# Patient Record
Sex: Female | Born: 1995 | Race: White | Hispanic: No | Marital: Single | Smoking: Never smoker
Health system: Southern US, Community
[De-identification: ages and names within clinical notes are randomized; demographics above are authoritative.]

## PROBLEM LIST (undated history)

## (undated) HISTORY — PX: TONSILECTOMY, ADENOIDECTOMY, BILATERAL MYRINGOTOMY AND TUBES: SHX2538

## (undated) HISTORY — PX: NASAL SEPTUM SURGERY: SHX37

---
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
## 2016-07-10 ENCOUNTER — Ambulatory Visit (INDEPENDENT_AMBULATORY_CARE_PROVIDER_SITE_OTHER): Payer: BLUE CROSS/BLUE SHIELD | Admitting: Physician Assistant

## 2016-07-10 VITALS — BP 122/80 | HR 75 | Temp 97.6°F | Resp 18 | Ht 65.0 in | Wt 159.0 lb

## 2016-07-10 DIAGNOSIS — Z3041 Encounter for surveillance of contraceptive pills: Secondary | ICD-10-CM | POA: Diagnosis not present

## 2016-07-10 LAB — POCT URINE PREGNANCY: Preg Test, Ur: NEGATIVE

## 2016-07-10 MED ORDER — LEVONORGESTREL-ETHINYL ESTRAD 0.1-20 MG-MCG PO TABS: 1.0000 | ORAL_TABLET | Freq: Every day | ORAL | 6 refills | Status: DC

## 2016-07-10 NOTE — Patient Instructions (Addendum)
Oral Contraception Information Oral contraceptive pills (OCPs) are medicines taken to prevent pregnancy. OCPs work by preventing the ovaries from releasing eggs. The hormones in OCPs also cause the cervical mucus to thicken, preventing the sperm from entering the uterus. The hormones also cause the uterine lining to become thin, not allowing a fertilized egg to attach to the inside of the uterus. OCPs are highly effective when taken exactly as prescribed. However, OCPs do not prevent sexually transmitted diseases (STDs). Safe sex practices, such as using condoms along with the pill, can help prevent STDs.  Before taking the pill, you may have a physical exam and Pap test. Your health care provider may order blood tests. The health care provider will make sure you are a good candidate for oral contraception. Discuss with your health care provider the possible side effects of the OCP you may be prescribed. When starting an OCP, it can take 2 to 3 months for the body to adjust to the changes in hormone levels in your body.  TYPES OF ORAL CONTRACEPTION  The combination pill--This pill contains estrogen and progestin (synthetic progesterone) hormones. The combination pill comes in 21-day, 28-day, or 91-day packs. Some types of combination pills are meant to be taken continuously (365-day pills). With 21-day packs, you do not take pills for 7 days after the last pill. With 28-day packs, the pill is taken every day. The last 7 pills are without hormones. Certain types of pills have more than 21 hormone-containing pills. With 91-day packs, the first 84 pills contain both hormones, and the last 7 pills contain no hormones or contain estrogen only.  The minipill--This pill contains the progesterone hormone only. The pill is taken every day continuously. It is very important to take the pill at the same time each day. The minipill comes in packs of 28 pills. All 28 pills contain the hormone.  ADVANTAGES OF ORAL  CONTRACEPTIVE PILLS  Decreases premenstrual symptoms.   Treats menstrual period cramps.   Regulates the menstrual cycle.   Decreases a heavy menstrual flow.   May treatacne, depending on the type of pill.   Treats abnormal uterine bleeding.   Treats polycystic ovarian syndrome.   Treats endometriosis.   Can be used as emergency contraception.  THINGS THAT CAN MAKE ORAL CONTRACEPTIVE PILLS LESS EFFECTIVE OCPs can be less effective if:   You forget to take the pill at the same time every day.   You have a stomach or intestinal disease that lessens the absorption of the pill.   You take OCPs with other medicines that make OCPs less effective, such as antibiotics, certain HIV medicines, and some seizure medicines.   You take expired OCPs.   You forget to restart the pill on day 7, when using the packs of 21 pills.  RISKS ASSOCIATED WITH ORAL CONTRACEPTIVE PILLS  Oral contraceptive pills can sometimes cause side effects, such as:  Headache.  Nausea.  Breast tenderness.  Irregular bleeding or spotting. Combination pills are also associated with a small increased risk of:  Blood clots.  Heart attack.  Stroke.   This information is not intended to replace advice given to you by your health care provider. Make sure you discuss any questions you have with your health care provider.   Document Released: 02/24/2003 Document Revised: 09/24/2013 Document Reviewed: 05/25/2013 Elsevier Interactive Patient Education 2016 [employer].  Safe Sex Safe sex is about reducing the risk of giving or getting a sexually transmitted disease (STD). STDs are spread through  sexual contact involving the genitals, mouth, or rectum. Some STDs can be cured and others cannot. Safe sex can also prevent unintended pregnancies.  WHAT ARE SOME SAFE SEX PRACTICES?  Limit your sexual activity to only one partner who is having sex with only you.  Talk to your partner about his or  her past partners, past STDs, and drug use.  Use a condom every time you have sexual intercourse. This includes vaginal, oral, and anal sexual activity. Both females and males should wear condoms during oral sex. Only use latex or polyurethane condoms and water-based lubricants. Using petroleum-based lubricants or oils to lubricate a condom will weaken the condom and increase the chance that it will break. The condom should be in place from the beginning to the end of sexual activity. Wearing a condom reduces, but does not completely eliminate, your risk of getting or giving an STD. STDs can be spread by contact with infected body fluids and skin.  Get vaccinated for hepatitis B and HPV.  Avoid alcohol and recreational drugs, which can affect your judgment. You may forget to use a condom or participate in high-risk sex.  For females, avoid douching after sexual intercourse. Douching can spread an infection farther into the reproductive tract.  Check your body for signs of sores, blisters, rashes, or unusual discharge. See your health care provider if you notice any of these signs.  Avoid sexual contact if you have symptoms of an infection or are being treated for an STD. If you or your partner has herpes, avoid sexual contact when blisters are present. Use condoms at all other times.  If you are at risk of being infected with HIV, it is recommended that you take a prescription medicine daily to prevent HIV infection. This is called pre-exposure prophylaxis (PrEP). You are considered at risk if:  You are a man who has sex with other men (MSM).  You are a heterosexual man or woman who is sexually active with more than one partner.  You take drugs by injection.  You are sexually active with a partner who has HIV.  Talk with your health care provider about whether you are at high risk of being infected with HIV. If you choose to begin PrEP, you should first be tested for HIV. You should then be  tested every 3 months for as long as you are taking PrEP.  See your health care provider for regular screenings, exams, and tests for other STDs. Before having sex with a new partner, each of you should be screened for STDs and should talk about the results with each other. WHAT ARE THE BENEFITS OF SAFE SEX?   There is less chance of getting or giving an STD.  You can prevent unwanted or unintended pregnancies.  By discussing safe sex concerns with your partner, you may increase feelings of intimacy, comfort, trust, and honesty between the two of you.   This information is not intended to replace advice given to you by your health care provider. Make sure you discuss any questions you have with your health care provider.   Document Released: 01/11/2005 Document Revised: 12/25/2014 Document Reviewed: 05/27/2012 Elsevier Interactive Patient Education 2016 [employer].   IF you received an x-ray today, you will receive an invoice from [facility]. Please contact [facility] at [phone] with questions or concerns regarding your invoice.   IF you received labwork today, you will receive an invoice from [employer]. Please contact [facility] at [phone] with  questions or concerns regarding your invoice.   Our billing staff will not be able to assist you with questions regarding bills from these companies.  You will be contacted with the lab results as soon as they are available. The fastest way to get your results is to activate your My Chart account. Instructions are located on the last page of this paperwork. If you have not heard from [place] regarding the results in 2 weeks, please contact this office.

## 2016-07-10 NOTE — Progress Notes (Signed)
   Dayton Stephney  MRN: [id number] DOB: 1996-02-20  Subjective:  Megan [name] is a 20 y.o. female seen in office today for medication refill of tri-previfem.   Pt's birth control has been prescribed by the student health center at the [employer] since 09/2015. However, pt is not returning to this school in the Fall and therefore needs a new provider to prescribe/monitor her birth control.    Pt reports starting birth control for irregular periods. States she is still having irregular cycles and the the cycles typically last longer than 7 days. The current birth control did help with pt's heavy flow. Pt reports taking the pill within in the same hour every day but some days she is off by a few hours. LMP: 06/07/2016.   Pt is currently sexually active. States she uses condoms almost every time. Has had three female partners in the last year. Pt got tested for STD 3 months ago and has been with the same partner since this time. Notes the STD tests were negative.    Pt does not smoke.   Of note, pt states that the previous provider who prescribed the tri-previfem did not explain other birth control options and did not educate her much on safe sex education. Pt would like to hear other options and learn more about safe sex practices today.    Review of Systems  Constitutional: Negative.     There are no active problems to display for this patient.  No current outpatient prescriptions on file prior to visit.   No current facility-administered medications on file prior to visit.     No Known Allergies  Objective:  BP 122/80 (BP Location: Right Arm)   Pulse 75   Temp 97.6 F (36.4 C) (Oral)   Resp 18   Ht 5\' 5"  (1.651 m)   Wt 159 lb (72.1 kg)   LMP 06/10/2016   SpO2 99%   BMI 26.46 kg/m   Physical Exam  Constitutional: She is oriented to person, place, and time and well-developed, well-nourished, and in no distress.  HENT:  Head: Normocephalic  and atraumatic.  Eyes: Conjunctivae are normal.  Neck: Normal range of motion.  Pulmonary/Chest: Effort normal.  Neurological: She is alert and oriented to person, place, and time. Gait normal.  Skin: Skin is warm and dry.  Psychiatric: Affect normal.  Vitals reviewed.  Results for orders placed or performed in visit on 07/10/16 (from the past 24 hour(s))  POCT urine pregnancy     Status: None   Collection Time: 07/10/16  3:44 PM  Result Value Ref Range   Preg Test, Ur Negative Negative    Assessment and Plan :   1. Encounter for surveillance of contraceptive pills -Discontinue tri-previfem after completing current pack -Begin taking levonorgestrel-ethinyl estradiol (AVIANE) 0.1-20 MG-MCG tablet; Take 1 tablet by mouth daily.  Dispense: 1 Package; Refill: 6 - POCT urine pregnancy --Log any breakthrough bleeding experienced after 3 months on the new medication. If no side effects, call in 6 months for refill of medication. If side effects, come back for reevaluation of medication.  -Safe sex education given   Total of 30 minutes spent with patient, greater than 50% of which was spent in birth control counseling and safe sex education.   Benjiman Core PA-C  [facility] 07/10/2016 3:19 PM

## 2016-08-28 ENCOUNTER — Telehealth: Payer: Self-pay

## 2016-08-28 NOTE — Telephone Encounter (Signed)
Birth Control is not covered by insurance.  She would like something else. [employer]   [phone]  [facility] [address] [address] [facility] [address]

## 2016-08-30 NOTE — Telephone Encounter (Signed)
I have contacted pt's pharmacy. The pharmacist notes that last month the prescription had a 0 dollar copay but this month it was 23 dollars so she is wondering if something changed with her insurance. Since the pt had just picked up the refill, the pharmacist could not enter in other medications into pt's portal to see what similar birth control would be a 0 copay. The pharmacist recommended that the pt call her insurance company and ask what birth controls they cover fully. She also notes that in three weeks before pt picks up her next refill, that we could call her back and she would be able to enter in different birth controls to see what her insurance covers. I have called the patient and let her know her options. She states she will call her insurance and ask what similar birth control they will cover at 0 dollar copay. She is to call our office back with this information so I can put in the prescription for the new medication.

## 2016-08-30 NOTE — Telephone Encounter (Signed)
Medication  levonorgestrel-ethinyl estradiol (AVIANE) 0.1-20 MG-MCG tablet [[id number]]  levonorgestrel-ethinyl estradiol (AVIANE) 0.1-20 MG-MCG tablet [[id number]][[id number]]  Order Details  Dose: 1 tablet Route: Oral Frequency: Daily  Dispense Quantity:  1 Package Refills:  6 Fills remaining:  --        Sig: Take 1 tablet by mouth daily.       Is there a reason you chose this one, can we give pt an alternative?

## 2016-09-01 NOTE — Telephone Encounter (Signed)
Left message to call back  

## 2016-09-01 NOTE — Telephone Encounter (Signed)
Megan [name], I spoke with patient. She stated that she called her insurance company yesterday and they refused to give her a list of birth control pills that they covered. They told her to call the pharmacy and take up the issue with them. She also stated that she has not changed insurance companies since last month. She is not sure of what to do at this point.

## 2016-09-05 NOTE — Telephone Encounter (Signed)
Patient is calling to let [place] know that she's still trying to figure out why the birth control isn't covered but hasn't be able to get any answer from the insurance or the pharmacy.

## 2016-09-05 NOTE — Telephone Encounter (Signed)
Please have patient call back and tell her insurance company that it is required that they give her a list of approved medications. If she is still having trouble tell her to contact me one week before her refill is due and I will call her pharmacy and have them figure out at that time which birth control will be covered. Thanks!

## 2016-09-07 NOTE — Telephone Encounter (Signed)
Attempted to call pt. No answer. Message left to call back. 

## 2016-10-02 ENCOUNTER — Telehealth: Payer: Self-pay

## 2016-10-02 DIAGNOSIS — Z3041 Encounter for surveillance of contraceptive pills: Secondary | ICD-10-CM

## 2016-10-02 NOTE — Telephone Encounter (Signed)
Patient is calling to let [place] know that her birth control medication will only be covered by insurance if [place] calls the medication in and have it mailed ordered. Please advise!  Patient phone: [phone]  [facility]: [phone]

## 2016-10-05 MED ORDER — LEVONORGESTREL-ETHINYL ESTRAD 0.1-20 MG-MCG PO TABS: 1.0000 | ORAL_TABLET | Freq: Every day | ORAL | 2 refills | Status: DC

## 2016-10-05 NOTE — Telephone Encounter (Signed)
[facility] order ph [facility] is [facility], so sent RFs in there. OV notes from July indicate that we can send in 6 more mos RFs after orig 6 mos as long as pt is doing well on medication. I LMOM for pt advising I will send in the remainder of the whole year (after which she will need f/u for more), BUT that she should call back and plan to come back in sooner if she is having any problems on the medication. [facility].

## 2017-05-21 ENCOUNTER — Encounter: Payer: Self-pay | Admitting: Physician Assistant

## 2017-05-21 ENCOUNTER — Ambulatory Visit (INDEPENDENT_AMBULATORY_CARE_PROVIDER_SITE_OTHER): Payer: BLUE CROSS/BLUE SHIELD | Admitting: Physician Assistant

## 2017-05-21 ENCOUNTER — Telehealth: Payer: Self-pay | Admitting: Physician Assistant

## 2017-05-21 VITALS — BP 134/81 | HR 71 | Temp 98.1°F | Resp 16 | Ht 65.5 in | Wt 159.8 lb

## 2017-05-21 DIAGNOSIS — N62 Hypertrophy of breast: Secondary | ICD-10-CM | POA: Diagnosis not present

## 2017-05-21 DIAGNOSIS — G8929 Other chronic pain: Secondary | ICD-10-CM | POA: Diagnosis not present

## 2017-05-21 DIAGNOSIS — Z23 Encounter for immunization: Secondary | ICD-10-CM | POA: Diagnosis not present

## 2017-05-21 DIAGNOSIS — F909 Attention-deficit hyperactivity disorder, unspecified type: Secondary | ICD-10-CM

## 2017-05-21 DIAGNOSIS — M546 Pain in thoracic spine: Secondary | ICD-10-CM

## 2017-05-21 DIAGNOSIS — Z79899 Other long term (current) drug therapy: Secondary | ICD-10-CM | POA: Diagnosis not present

## 2017-05-21 DIAGNOSIS — Z3041 Encounter for surveillance of contraceptive pills: Secondary | ICD-10-CM

## 2017-05-21 DIAGNOSIS — J302 Other seasonal allergic rhinitis: Secondary | ICD-10-CM

## 2017-05-21 MED ORDER — FLUTICASONE PROPIONATE 50 MCG/ACT NA SUSP: 2.0000 | Freq: Every day | NASAL | 3 refills | Status: AC

## 2017-05-21 MED ORDER — LEVONORGESTREL-ETHINYL ESTRAD 0.1-20 MG-MCG PO TABS: 1.0000 | ORAL_TABLET | Freq: Every day | ORAL | 4 refills | Status: DC

## 2017-05-21 MED ORDER — ATOMOXETINE HCL 40 MG PO CAPS: 40.0000 mg | ORAL_CAPSULE | Freq: Every day | ORAL | 0 refills | Status: DC

## 2017-05-21 NOTE — Patient Instructions (Addendum)
  Strattera - Take in the morning. Start '40mg'$  qd x 1 week; May increase 60 mg qd x 1 week; may increase to '80mg'$  qd x 1 week. Please come back and see me in 4 weeks   You will receive a phone call to schedule appt with surgeon.   Thank you for coming in today. I hope you feel we met your needs.  Feel free to call [facility] if you have any questions or further requests.  Please consider signing up for MyChart if you do not already have it, as this is a great way to communicate with me.  Best,  Whitney McVey, PA-C   IF you received an x-ray today, you will receive an invoice from [facility]. Please contact [facility] at [phone] with questions or concerns regarding your invoice.   IF you received labwork today, you will receive an invoice from [place]. Please contact [facility] at [phone] with questions or concerns regarding your invoice.   Our billing staff will not be able to assist you with questions regarding bills from these companies.  You will be contacted with the lab results as soon as they are available. The fastest way to get your results is to activate your My Chart account. Instructions are located on the last page of this paperwork. If you have not heard from [place] regarding the results in 2 weeks, please contact this office.

## 2017-05-21 NOTE — Telephone Encounter (Signed)
[facility] [address] [facility] a prescription.  Pt came in to pharmacy stating that she could cut the Strattera capsule in half and take that way but this prescription cannot be cut or crushed since it is a capsule.  Please advise [phone]

## 2017-05-21 NOTE — Telephone Encounter (Signed)
Please see directions and advise

## 2017-05-21 NOTE — Progress Notes (Signed)
Particia [name]  MRN: [id number] DOB: June 29, 1996  PCP: Magdalene River, PA-C  Subjective:  Pt is a pleasant 21 year old female who presents to clinic for multiple complaints.   Medication refill - She needs refill of flonase, and birth control pills. She was switched to Avaine at her last OV due to irregular periods and heavy bleeding. Today she reports her bleeding is well controlled, much more predictable and manageable. Her periods last about 6-7 days. Denies medication side effects. Denies light headedness, dizziness, abdominal pain, back pain. She is having intercourse with one female partner, uses condoms. Feels confident about safe sex practices.  H/o seasonal allergies. C/o watery eyes, runny nose, sneezing. Uses flonase - needs refill. Works well for her. She is also taking Zyrtec.   Large breasts - pt reports large breast size x 5 years. She was a 78 F in high school and is now a 59 G. "It doesn't matter if I weight 115lbs or 150 lbs they are always really big". C/o back pain, shob while sleeping, pain with running, shoulders and arm going numb while asleep. She has to wear two sports bras to exercise, and her breasts are still not well supported.  She has been to physical therapy in the past for core and back strengthening. She is interested in breast reduction surgery.   Difficulty with concentration x several years - she went to [facility] psychology clinic recently for a psychological evaluation. She brought the paperwork with her today, showing she has ADHD symptoms. She has applied the recommendations as far as study tips, tutoring, more time for testing. These methods are helping, however she is still having a difficult time staying on task. She is interested in medication.   Review of Systems  Constitutional: Negative for chills, diaphoresis, fatigue and fever.  HENT: Positive for postnasal drip, rhinorrhea and sneezing.   Eyes: Positive for itching. Negative for  photophobia, pain and visual disturbance.  Respiratory: Positive for shortness of breath. Negative for cough, chest tightness and wheezing.   Cardiovascular: Negative for chest pain and palpitations.  Gastrointestinal: Negative for abdominal pain, diarrhea, nausea and vomiting.  Musculoskeletal: Positive for back pain.  Allergic/Immunologic: Positive for environmental allergies.  Neurological: Positive for numbness. Negative for weakness, light-headedness and headaches.  Psychiatric/Behavioral: Positive for decreased concentration and sleep disturbance.    There are no active problems to display for this patient.   Current Outpatient Prescriptions on File Prior to Visit  Medication Sig Dispense Refill  . fluticasone (FLONASE) 50 MCG/ACT nasal spray Place into both nostrils daily.    Marland Kitchen levonorgestrel-ethinyl estradiol (AVIANE) 0.1-20 MG-MCG tablet Take 1 tablet by mouth daily. 3 Package 2  . propranolol (INDERAL) 40 MG tablet Take 40 mg by mouth 3 (three) times daily.     No current facility-administered medications on file prior to visit.     No Known Allergies   Objective:  BP 134/81   Pulse 71   Temp 98.1 F (36.7 C) (Oral)   Resp 16   Ht 5' 5.5" (1.664 m)   Wt 159 lb 12.8 oz (72.5 kg)   LMP 05/14/2017   SpO2 98%   BMI 26.19 kg/m   Physical Exam  Constitutional: She is oriented to person, place, and time and well-developed, well-nourished, and in no distress. No distress.  Cardiovascular: Normal rate, regular rhythm and normal heart sounds.   Pulmonary/Chest: Effort normal and breath sounds normal. No respiratory distress.  Musculoskeletal:       Thoracic  back: She exhibits no tenderness and no bony tenderness.       Lumbar back: She exhibits no tenderness and no bony tenderness.  Neurological: She is alert and oriented to person, place, and time. GCS score is 15.  Skin: Skin is warm and dry.  Psychiatric: Mood, memory, affect and judgment normal.  Vitals  reviewed.   Assessment and Plan :  1. Encounter for medication management 2. Encounter for surveillance of contraceptive pills - levonorgestrel-ethinyl estradiol (AVIANE) 0.1-20 MG-MCG tablet; Take 1 tablet by mouth daily.  Dispense: 3 Package; Refill: 4 - Pt doing well with new OCPs, denies side effects. Safe sex practices reviewed. OK to refill 1 year.   3. Seasonal allergic rhinitis, unspecified trigger - fluticasone (FLONASE) 50 MCG/ACT nasal spray; Place 2 sprays into both nostrils daily.  Dispense: 16 g; Refill: 3 - Con't OTC oral antihistamines. RTC if symptoms worsen, consider allergy referral if needed.   4. Large breasts 5. Chronic bilateral thoracic back pain - Ambulatory referral to Plastic Surgery - Pt suffers from years of musculoskeletal complaints due to large breast size. She has tried physical therapy in the past and wears 2 supportive bras - she is still experiencing back pain and problems sleeping. Will refer to plastic surgery for consideration of possible breast reduction.   6. Attention deficit hyperactivity disorder (ADHD), unspecified ADHD type - atomoxetine (STRATTERA) 40 MG capsule; Take 1 capsule (40 mg total) by mouth daily. Take in the morning. 40mg  qd x 1 week; May increase 60 mg qd x 1 week; may increase to 80mg  qd x 1 week.  Dispense: 60 capsule; Refill: 0 - Pt was [facility] and diagnosed with ADHD symptoms. She is applying the recommended techniques, however continues to have difficulty concentrating. Will start Strattera, she may increase dose if needed. RTC in 4 weeks for f/u.   7. Need for Tdap vaccination - Tdap vaccine greater than or equal to 7yo IM   Marco CollieWhitney Muhammad Vacca, PA-C  [facility] 05/21/2017 11:16 AM

## 2017-05-23 ENCOUNTER — Other Ambulatory Visit: Payer: Self-pay | Admitting: Physician Assistant

## 2017-05-23 NOTE — Telephone Encounter (Signed)
Please call pt and let her know I will write her the 60mg  Rx if she feels like she needs to increase. Just call us and let me know if needed.  Thank you! Whitney

## 2017-05-24 NOTE — Telephone Encounter (Signed)
lmtcb

## 2017-05-26 NOTE — Telephone Encounter (Signed)
Lm to call back

## 2017-06-01 NOTE — Telephone Encounter (Signed)
Left message to return call 

## 2017-06-01 NOTE — Telephone Encounter (Signed)
lmtcb

## 2017-06-04 NOTE — Telephone Encounter (Signed)
Unable to reach letter mailed to pt home

## 2017-06-11 ENCOUNTER — Telehealth: Payer: Self-pay | Admitting: Physician Assistant

## 2017-06-11 NOTE — Telephone Encounter (Signed)
Pt is returning our call   Best number [phone]

## 2017-06-12 NOTE — Telephone Encounter (Signed)
Called the patient, she got her prescription, and everything is in place now . She has an appointment for follow up next month.

## 2017-06-18 ENCOUNTER — Ambulatory Visit: Payer: BLUE CROSS/BLUE SHIELD | Admitting: Physician Assistant

## 2017-07-04 ENCOUNTER — Encounter: Payer: Self-pay | Admitting: Physician Assistant

## 2017-07-04 ENCOUNTER — Ambulatory Visit (INDEPENDENT_AMBULATORY_CARE_PROVIDER_SITE_OTHER): Payer: BLUE CROSS/BLUE SHIELD | Admitting: Physician Assistant

## 2017-07-04 VITALS — BP 130/84 | HR 92 | Temp 97.9°F | Resp 16 | Ht 65.5 in | Wt 153.6 lb

## 2017-07-04 DIAGNOSIS — R41840 Attention and concentration deficit: Secondary | ICD-10-CM

## 2017-07-04 DIAGNOSIS — Z7189 Other specified counseling: Secondary | ICD-10-CM | POA: Diagnosis not present

## 2017-07-04 DIAGNOSIS — Z9189 Other specified personal risk factors, not elsewhere classified: Secondary | ICD-10-CM | POA: Diagnosis not present

## 2017-07-04 MED ORDER — DEXMETHYLPHENIDATE HCL 2.5 MG PO TABS: 2.5000 mg | ORAL_TABLET | Freq: Two times a day (BID) | ORAL | 0 refills | Status: DC

## 2017-07-04 NOTE — Progress Notes (Signed)
Megan [name]  MRN: [id number] DOB: 1995-12-22  PCP: Magdalene River, PA-C  Subjective:  Pt is a 21 year old female who presents to clinic for medication counseling.  Concentration difficulty: HPI from 6/04: Difficulty with concentration x several years - she went to [facility] psychology clinic recently for a psychological evaluation. She brought the paperwork with her today, showing she has ADHD symptoms. She has applied the recommendations as far as study tips, tutoring, more time for testing. These methods are helping, however she is still having a difficult time staying on task. 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   Last OV pt started Strattera 40mg  qd 1 week; May increase 60 mg qd x 1 week; may increase to 80mg  qd x 1 week. She is taking 80mg . She has been feeling "exhausted" when she upped the dose. Feeling tired, often has to take naps. She is not feeling improvement in her concentration. Her mother has not noticed a difference either. She is currently taking summer classes. She starts school Aug 21.    H/o fainting episodes - She recently had two spells back-to-back. One on June 23 and the second a few days later. She was "out" for about 30 seconds - this was longer than normal. She has these episodes about once yearly x several years. She has been evaluated by cardiology in the past - about 8-9 years ago. Negative orthostatic tests. She reports eating regularly scheduled meals, she is drinking plenty of fluids. Denies lightheadedness, dizziness, shob, headache, vision changes, sezures. She has not been sick recently.   Review of Systems  Constitutional: Negative for chills, diaphoresis, fatigue and fever.  Respiratory: Negative for chest tightness and shortness of breath.   Cardiovascular: Negative for chest pain and palpitations.  Neurological:  Positive for syncope. Negative for dizziness, seizures, light-headedness and headaches.  Psychiatric/Behavioral: Positive for decreased concentration.    There are no active problems to display for this patient.   Current Outpatient Prescriptions on File Prior to Visit  Medication Sig Dispense Refill  . atomoxetine (STRATTERA) 40 MG capsule Take 1 capsule (40 mg total) by mouth daily. Take in the morning. 40mg  qd x 1 week; May increase 60 mg qd x 1 week; may increase to 80mg  qd x 1 week. 60 capsule 0  . fluticasone (FLONASE) 50 MCG/ACT nasal spray Place 2 sprays into both nostrils daily. 16 g 3  . levonorgestrel-ethinyl estradiol (AVIANE) 0.1-20 MG-MCG tablet Take 1 tablet by mouth daily. 3 Package 4  . propranolol (INDERAL) 40 MG tablet Take 40 mg by mouth 3 (three) times daily.     No current facility-administered medications on file prior to visit.     No Known Allergies   Objective:  BP 130/84   Pulse 92   Temp 97.9 F (36.6 C) (Oral)   Resp 16   Ht 5' 5.5" (1.664 m)   Wt 153 lb 9.6 oz (69.7 kg)   LMP 06/18/2017   SpO2 97%   BMI 25.17 kg/m   Physical Exam  Constitutional: She is oriented to person, place, and time and well-developed, well-nourished, and in no distress. No distress.  Eyes: Pupils are equal, round, and reactive to light. Conjunctivae and EOM are normal.  Cardiovascular: Normal rate, regular rhythm and normal heart sounds.   Neurological: She is alert and oriented to person, place, and time. GCS score is 15.  Skin: Skin is warm  and dry.  Psychiatric: Mood, memory, affect and judgment normal.  Vitals reviewed.   Assessment and Plan :  1. Difficulty concentrating 2. Encounter for medication counseling - dexmethylphenidate (FOCALIN) 2.5 MG tablet; Take 1 tablet (2.5 mg total) by mouth 2 (two) times daily. May increase dose after 1 week by 2.5mg /day  Dispense: 90 tablet; Refill: 0 - Will d/c Strattera. Start Focalin 2.5mg  bid. She may increase dose after 1  week by 2.5mg  day. RTC in 4 weeks for recheck.   3. History of fainting spells of unknown cause - Ambulatory referral to Neurology - H/o fainting spells. Recent episodes with unknown trigger. She is feeling well today. Cardiac work-up in the past was negative. Will refer to neurology for evaluation.   Marco CollieWhitney Pike Scantlebury, PA-C  Primary Care at [facility] 07/04/2017 11:28 AM

## 2017-07-04 NOTE — Patient Instructions (Addendum)
Strattera may be discontinued without the need for tapering dose.   Start taking Focalin: Initial: 2.5 mg twice daily; you can increase the dose in increments of 2.5 to 5 mg at weekly intervals (maximum dose: 20 mg/day) Come back in 4 weeks for recheck.   Do not take this medication if you become pregnant. Either studies in animals have revealed adverse effects on the fetus (teratogenic or embryocidal effects or other) and there are no controlled studies in women or studies in women and animals are not available. Drugs should be given only if the potential benefits justify the potential risk to the fetus.  Thank you for coming in today. I hope you feel we met your needs.  Feel free to call PCP if you have any questions or further requests.  Please consider signing up for MyChart if you do not already have it, as this is a great way to communicate with me.  Best,  Whitney McVey, PA-C  Dexmethylphenidate tablets What is this medicine? DEXMETHYLPHENIDATE (dex meth ill FEN i date) is used to treat attention-deficit hyperactivity disorder. Federal law prohibits the transfer of this medicine to any person other than the person for whom it was prescribed. Do not share this medicine with anyone else. This medicine may be used for other purposes; ask your health care provider or pharmacist if you have questions. COMMON BRAND NAME(S): Focalin What should I tell my health care provider before I take this medicine? They need to know if you have any of these conditions: -anxiety or panic attacks -circulation problems in fingers and toes -glaucoma -hardening or blockages of the arteries or heart blood vessels -heart disease or a heart defect -high blood pressure -history of a drug or alcohol abuse problem -history of stroke -liver disease -mental illness -motor tics, family history or diagnosis of Tourette's syndrome -seizures -suicidal thoughts, plans, or attempt; a previous suicide attempt by  you or a family member -thyroid disease -an unusual or allergic reaction to dexmethylphenidate, methylphenidate, other medicines, foods, dyes, or preservatives -pregnant or trying to get pregnant -breast-feeding How should I use this medicine? Take this medicine by mouth with a glass of water. Follow the directions on the prescription label. You can take this medicine with or without food. Take your doses at regular intervals. Usually the last dose of the day will be taken at least 4 to 6 hours before your normal bedtime, so it will not interfere with sleep. Do not take your medicine more often than directed. A special MedGuide will be given to you by the pharmacist with each prescription and refill. Be sure to read this information carefully each time. Talk to your pediatrician regarding the use of this medicine in children. While this medicine may be prescribed for children as young as 6 years for selected conditions, precautions do apply. Overdosage: If you think you have taken too much of this medicine contact a poison control center or emergency room at once. NOTE: This medicine is only for you. Do not share this medicine with others. What if I miss a dose? If you miss a dose, take it as soon as you can. If it is almost time for your next dose, take only that dose. Do not take double or extra doses. What may interact with this medicine? Do not take this medicine with any of the following medications: -lithium -MAOIs like Carbex, Eldepryl, Marplan, Nardil, and Parnate -other stimulant medicines for attention disorders, weight loss, or to stay awake -procarbazine This  medicine may also interact with the following medications: -atomoxetine -caffeine -certain medicines for blood pressure, heart disease, irregular heart beat -certain medicines for depression, anxiety, or psychotic disturbances -certain medicines for seizures like carbamazepine, phenobarbital, phenytoin -cold or allergy  medicines -medicines that increase the blood pressure like dopamine, dobutamine, or ephedrine -warfarin This list may not describe all possible interactions. Give your health care provider a list of all the medicines, herbs, non-prescription drugs, or dietary supplements you use. Also tell them if you smoke, drink alcohol, or use illegal drugs. Some items may interact with your medicine. What should I watch for while using this medicine? Visit your doctor or health care professional for regular checks on your progress. This prescription requires that you follow special procedures with your doctor and pharmacy. You will need to have a new written prescription from your doctor or health care professional every time you need a refill. This medicine may affect your concentration, or hide signs of tiredness. Until you know how this drug affects you, do not drive, ride a bicycle, use machinery, or do anything that needs mental alertness. Tell your doctor or health care professional if this medicine loses its effects, or if you feel you need to take more than the prescribed amount. Do not change the dosage without talking to your doctor or health care professional. For males, contact you doctor or health care professional right away if you have an erection that lasts longer than 4 hours or if it becomes painful. This may be a sign of serious problem and must be treated right away to prevent permanent damage. Decreased appetite is a common side effect when starting this medicine. Eating small, frequent meals or snacks can help. Talk to your doctor if you continue to have poor eating habits. Height and weight growth of a child taking this medicine will be monitored closely. Do not take this medicine close to bedtime. It may prevent you from sleeping. If you are going to need surgery, a MRI, CT scan, or other procedure, tell your doctor that you are taking this medicine. You may need to stop taking this medicine  before the procedure. Tell your doctor or healthcare professional right away if you notice unexplained wounds on your fingers and toes while taking this medicine. You should also tell your healthcare provider if you experience numbness or pain, changes in the skin color, or sensitivity to temperature in your fingers or toes. What side effects may I notice from receiving this medicine? Side effects that you should report to your doctor or health care professional as soon as possible: -allergic reactions like skin rash, itching or hives, swelling of the face, lips, or tongue -changes in vision -chest pain or chest tightness -fast, irregular heartbeat -fingers or toes feel numb, cool, painful -hallucination, loss of contact with reality -high blood pressure -males: prolonged or painful erection -seizures -severe headaches -shortness of breath -suicidal thoughts or other mood changes -trouble walking, dizziness, loss of balance or coordination -uncontrollable head, mouth, neck, arm, or leg movements -unusual bleeding or bruising Side effects that usually do not require medical attention (report to your doctor or health care professional if they continue or are bothersome): -anxious -headache -loss of appetite -nausea, vomiting -trouble sleeping -weight loss This list may not describe all possible side effects. Call your doctor for medical advice about side effects. You may report side effects to FDA at 1-800-FDA-1088. Where should I keep my medicine? Keep out of the reach of children. This medicine  can be abused. Keep your medicine in a safe place to protect it from theft. Do not share this medicine with anyone. Selling or giving away this medicine is dangerous and against the law. This medicine may cause accidental overdose and death if taken by other adults, children, or pets. Mix any unused medicine with a substance like cat litter or coffee grounds. Then throw the medicine away in a  sealed container like a sealed bag or a coffee can with a lid. Do not use the medicine after the expiration date. Store at room temperature between 15 and 30 degrees C (59 and 86 degrees F). Protect from light and moisture. NOTE: This sheet is a summary. It may not cover all possible information. If you have questions about this medicine, talk to your doctor, pharmacist, or health care provider.  2018 Elsevier/Gold Standard (2014-08-25 15:09:50)  IF you received an x-ray today, you will receive an invoice from [facility]. Please contact [facility] at [phone] with questions or concerns regarding your invoice.   IF you received labwork today, you will receive an invoice from [facility]. Please contact [facility] at [phone] with questions or concerns regarding your invoice.   Our billing staff will not be able to assist you with questions regarding bills from these companies.  You will be contacted with the lab results as soon as they are available. The fastest way to get your results is to activate your My Chart account. Instructions are located on the last page of this paperwork. If you have not heard from [place] regarding the results in 2 weeks, please contact this office.

## 2017-07-09 ENCOUNTER — Telehealth: Payer: Self-pay | Admitting: Family Medicine

## 2017-07-09 NOTE — Telephone Encounter (Signed)
[facility] CALLING STATING THAT INSURANCE WANT COVER DEXMETHYLPHENIDATE 90 PILLS BUT WILL COVER DEXMETHYLPHENIDATE 60 PILLS PLEASE RESPOND

## 2017-07-09 NOTE — Telephone Encounter (Signed)
Pt called to check status of rx.  [phone] Please call to advise

## 2017-07-11 ENCOUNTER — Other Ambulatory Visit: Payer: Self-pay

## 2017-07-11 DIAGNOSIS — R41840 Attention and concentration deficit: Secondary | ICD-10-CM

## 2017-07-11 MED ORDER — DEXMETHYLPHENIDATE HCL 2.5 MG PO TABS: 2.5000 mg | ORAL_TABLET | Freq: Two times a day (BID) | ORAL | 0 refills | Status: DC

## 2017-07-11 NOTE — Telephone Encounter (Signed)
New rx for 60 tablets placed in your box.

## 2017-07-12 NOTE — Progress Notes (Unsigned)
Left message script is ready for pick up.

## 2017-07-12 NOTE — Telephone Encounter (Signed)
done

## 2017-07-18 ENCOUNTER — Encounter: Payer: Self-pay | Admitting: Physician Assistant

## 2017-07-18 ENCOUNTER — Ambulatory Visit (INDEPENDENT_AMBULATORY_CARE_PROVIDER_SITE_OTHER): Payer: BLUE CROSS/BLUE SHIELD | Admitting: Physician Assistant

## 2017-07-18 VITALS — BP 126/82 | HR 79 | Temp 98.1°F | Resp 16 | Ht 65.5 in | Wt 153.2 lb

## 2017-07-18 DIAGNOSIS — Z79899 Other long term (current) drug therapy: Secondary | ICD-10-CM | POA: Diagnosis not present

## 2017-07-18 DIAGNOSIS — F909 Attention-deficit hyperactivity disorder, unspecified type: Secondary | ICD-10-CM

## 2017-07-18 NOTE — Progress Notes (Signed)
   Megan [name]  MRN: [id number] DOB: 11-Aug-1996  PCP: Magdalene RiverWiseman, Brittany D, PA-C  Subjective:  Pt is a 21 year old female who presents to clinic for f/u ADHD. This is her 3rd visit for this problem. Last OV 7/18.  She first tried Theatre managertrattera and did not notice improvement. Second visit she was started on Focalin 2.5mg . "She may increase dose after 1 week by 2.5mg  day. RTC in 4 weeks for recheck" She has been on this x 5 days due [name] start because of insurance problems. Notes mild improvement of her symptoms over the past few days. She is able to stay on task a little better. Denies medication side effects.   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   Review of Systems  Cardiovascular: Negative for chest pain and palpitations.  Gastrointestinal: Negative for nausea and vomiting.  Psychiatric/Behavioral: Positive for decreased concentration.    There are no active problems to display for this patient.   Current Outpatient Prescriptions on File Prior to Visit  Medication Sig Dispense Refill  . dexmethylphenidate (FOCALIN) 2.5 MG tablet Take 1 tablet (2.5 mg total) by mouth 2 (two) times daily. May increase dose after 1 week by 2.5mg /day 60 tablet 0  . fluticasone (FLONASE) 50 MCG/ACT nasal spray Place 2 sprays into both nostrils daily. 16 g 3  . levonorgestrel-ethinyl estradiol (AVIANE) 0.1-20 MG-MCG tablet Take 1 tablet by mouth daily. 3 Package 4  . propranolol (INDERAL) 40 MG tablet Take 40 mg by mouth 3 (three) times daily.     No current facility-administered medications on file prior to visit.     No Known Allergies   Objective:  BP 126/82   Pulse 79   Temp 98.1 F (36.7 C) (Oral)   Resp 16   Ht 5' 5.5" (1.664 m)   Wt 153 lb 3.2 oz (69.5 kg)   LMP 06/18/2017   SpO2 98%   BMI 25.11 kg/m   Physical Exam    Constitutional: She is oriented to person, place, and time and well-developed, well-nourished, and in no distress. No distress.  Cardiovascular: Normal rate, regular rhythm and normal heart sounds.   Neurological: She is alert and oriented to person, place, and time. GCS score is 15.  Skin: Skin is warm and dry.  Psychiatric: Mood, memory, affect and judgment normal.  Vitals reviewed.   Assessment and Plan :  1. Attention deficit hyperactivity disorder (ADHD), unspecified ADHD type 2. Encounter for medication management - Increase Focalin to 5mg /day. She leaves for school next week. OK to fill 3 months of medication once she finds the dose with the best therapeutic impact. RTC in 3 months.   Marco CollieWhitney Courtenay Creger, PA-C  [facility] at [facility] 07/18/2017 11:48 AM

## 2017-07-18 NOTE — Patient Instructions (Addendum)
Let me know know what dose is working well for you. I will write you a 3 month Rx at that dose. You will have to come and pick it up.  Have fun at school!  Come back and see me in 3 months.   Thank you for coming in today. I hope you feel we met your needs.  Feel free to call PCP if you have any questions or further requests.  Please consider signing up for MyChart if you do not already have it, as this is a great way to communicate with me.  Best,  Whitney McVey, PA-C   Dexmethylphenidate tablets What is this medicine? DEXMETHYLPHENIDATE (dex meth ill FEN i date) is used to treat attention-deficit hyperactivity disorder. Federal law prohibits the transfer of this medicine to any person other than the person for whom it was prescribed. Do not share this medicine with anyone else. This medicine may be used for other purposes; ask your health care provider or pharmacist if you have questions. COMMON BRAND NAME(S): Focalin What should I tell my health care provider before I take this medicine? They need to know if you have any of these conditions: -anxiety or panic attacks -circulation problems in fingers and toes -glaucoma -hardening or blockages of the arteries or heart blood vessels -heart disease or a heart defect -high blood pressure -history of a drug or alcohol abuse problem -history of stroke -liver disease -mental illness -motor tics, family history or diagnosis of Tourette's syndrome -seizures -suicidal thoughts, plans, or attempt; a previous suicide attempt by you or a family member -thyroid disease -an unusual or allergic reaction to dexmethylphenidate, methylphenidate, other medicines, foods, dyes, or preservatives -pregnant or trying to get pregnant -breast-feeding How should I use this medicine? Take this medicine by mouth with a glass of water. Follow the directions on the prescription label. You can take this medicine with or without food. Take your doses at regular  intervals. Usually the last dose of the day will be taken at least 4 to 6 hours before your normal bedtime, so it will not interfere with sleep. Do not take your medicine more often than directed. A special MedGuide will be given to you by the pharmacist with each prescription and refill. Be sure to read this information carefully each time. Talk to your pediatrician regarding the use of this medicine in children. While this medicine may be prescribed for children as young as 6 years for selected conditions, precautions do apply. Overdosage: If you think you have taken too much of this medicine contact a poison control center or emergency room at once. NOTE: This medicine is only for you. Do not share this medicine with others. What if I miss a dose? If you miss a dose, take it as soon as you can. If it is almost time for your next dose, take only that dose. Do not take double or extra doses. What may interact with this medicine? Do not take this medicine with any of the following medications: -lithium -MAOIs like Carbex, Eldepryl, Marplan, Nardil, and Parnate -other stimulant medicines for attention disorders, weight loss, or to stay awake -procarbazine This medicine may also interact with the following medications: -atomoxetine -caffeine -certain medicines for blood pressure, heart disease, irregular heart beat -certain medicines for depression, anxiety, or psychotic disturbances -certain medicines for seizures like carbamazepine, phenobarbital, phenytoin -cold or allergy medicines -medicines that increase the blood pressure like dopamine, dobutamine, or ephedrine -warfarin This list may not describe all possible interactions.  Give your health care provider a list of all the medicines, herbs, non-prescription drugs, or dietary supplements you use. Also tell them if you smoke, drink alcohol, or use illegal drugs. Some items may interact with your medicine. What should I watch for while using  this medicine? Visit your doctor or health care professional for regular checks on your progress. This prescription requires that you follow special procedures with your doctor and pharmacy. You will need to have a new written prescription from your doctor or health care professional every time you need a refill. This medicine may affect your concentration, or hide signs of tiredness. Until you know how this drug affects you, do not drive, ride a bicycle, use machinery, or do anything that needs mental alertness. Tell your doctor or health care professional if this medicine loses its effects, or if you feel you need to take more than the prescribed amount. Do not change the dosage without talking to your doctor or health care professional. For males, contact you doctor or health care professional right away if you have an erection that lasts longer than 4 hours or if it becomes painful. This may be a sign of serious problem and must be treated right away to prevent permanent damage. Decreased appetite is a common side effect when starting this medicine. Eating small, frequent meals or snacks can help. Talk to your doctor if you continue to have poor eating habits. Height and weight growth of a child taking this medicine will be monitored closely. Do not take this medicine close to bedtime. It may prevent you from sleeping. If you are going to need surgery, a MRI, CT scan, or other procedure, tell your doctor that you are taking this medicine. You may need to stop taking this medicine before the procedure. Tell your doctor or healthcare professional right away if you notice unexplained wounds on your fingers and toes while taking this medicine. You should also tell your healthcare provider if you experience numbness or pain, changes in the skin color, or sensitivity to temperature in your fingers or toes. What side effects may I notice from receiving this medicine? Side effects that you should report to  your doctor or health care professional as soon as possible: -allergic reactions like skin rash, itching or hives, swelling of the face, lips, or tongue -changes in vision -chest pain or chest tightness -fast, irregular heartbeat -fingers or toes feel numb, cool, painful -hallucination, loss of contact with reality -high blood pressure -males: prolonged or painful erection -seizures -severe headaches -shortness of breath -suicidal thoughts or other mood changes -trouble walking, dizziness, loss of balance or coordination -uncontrollable head, mouth, neck, arm, or leg movements -unusual bleeding or bruising Side effects that usually do not require medical attention (report to your doctor or health care professional if they continue or are bothersome): -anxious -headache -loss of appetite -nausea, vomiting -trouble sleeping -weight loss This list may not describe all possible side effects. Call your doctor for medical advice about side effects. You may report side effects to FDA at 1-800-FDA-1088. Where should I keep my medicine? Keep out of the reach of children. This medicine can be abused. Keep your medicine in a safe place to protect it from theft. Do not share this medicine with anyone. Selling or giving away this medicine is dangerous and against the law. This medicine may cause accidental overdose and death if taken by other adults, children, or pets. Mix any unused medicine with a substance like cat litter  or coffee grounds. Then throw the medicine away in a sealed container like a sealed bag or a coffee can with a lid. Do not use the medicine after the expiration date. Store at room temperature between 15 and 30 degrees C (59 and 86 degrees F). Protect from light and moisture. NOTE: This sheet is a summary. It may not cover all possible information. If you have questions about this medicine, talk to your doctor, pharmacist, or health care provider.  2018 [employer]vier/Gold Standard  (2014-08-25 15:09:50)   IF you received an x-ray today, you will receive an invoice from [facility]. Please contact [facility] at [phone] with questions or concerns regarding your invoice.   IF you received labwork today, you will receive an invoice from [facility]. Please contact [facility] at [phone] with questions or concerns regarding your invoice.   Our billing staff will not be able to assist you with questions regarding bills from these companies.  You will be contacted with the lab results as soon as they are available. The fastest way to get your results is to activate your My Chart account. Instructions are located on the last page of this paperwork. If you have not heard from [place] regarding the results in 2 weeks, please contact this office.

## 2017-07-19 DIAGNOSIS — F909 Attention-deficit hyperactivity disorder, unspecified type: Secondary | ICD-10-CM | POA: Insufficient documentation

## 2017-07-24 ENCOUNTER — Other Ambulatory Visit: Payer: Self-pay | Admitting: Physician Assistant

## 2017-07-24 DIAGNOSIS — R41840 Attention and concentration deficit: Secondary | ICD-10-CM

## 2017-07-26 ENCOUNTER — Telehealth: Payer: Self-pay | Admitting: Family Medicine

## 2017-07-26 NOTE — Telephone Encounter (Signed)
Per pt note 8/1 dose to be increased to 5mg .  Pt sent note that this dose is working for her. Per 8/1 visit, McVey states pt can get 3 mo refill of the dose that works for her.   Refilled per McVey instructions. #60 with 2 RF. To [facility] [address]

## 2017-07-26 NOTE — Telephone Encounter (Signed)
Pt would like to change the Focalin to 7.5 mg first dose then the second dose to 2.5 mg she states that she will be going to school soon

## 2017-07-26 NOTE — Telephone Encounter (Signed)
Thank you :)

## 2017-07-26 NOTE — Telephone Encounter (Signed)
See note below

## 2017-08-01 ENCOUNTER — Ambulatory Visit: Payer: BLUE CROSS/BLUE SHIELD | Admitting: Physician Assistant

## 2017-08-15 ENCOUNTER — Encounter: Payer: Self-pay | Admitting: Physician Assistant

## 2017-08-15 ENCOUNTER — Other Ambulatory Visit: Payer: Self-pay | Admitting: Physician Assistant

## 2017-08-15 DIAGNOSIS — R41840 Attention and concentration deficit: Secondary | ICD-10-CM

## 2017-08-15 DIAGNOSIS — F909 Attention-deficit hyperactivity disorder, unspecified type: Secondary | ICD-10-CM

## 2017-08-15 MED ORDER — DEXMETHYLPHENIDATE HCL 5 MG PO TABS: 5.0000 mg | ORAL_TABLET | ORAL | 0 refills | Status: AC

## 2017-08-15 MED ORDER — DEXMETHYLPHENIDATE HCL 5 MG PO TABS: 5.0000 mg | ORAL_TABLET | ORAL | 0 refills | Status: DC

## 2017-08-15 MED ORDER — DEXMETHYLPHENIDATE HCL 2.5 MG PO TABS: 2.5000 mg | ORAL_TABLET | Freq: Two times a day (BID) | ORAL | 0 refills | Status: AC

## 2017-08-16 ENCOUNTER — Telehealth: Payer: Self-pay | Admitting: *Deleted

## 2017-08-16 NOTE — Telephone Encounter (Signed)
[facility]

## 2017-08-23 NOTE — Progress Notes (Signed)
Spoke with patient, she states that she was given Rx for 2.5mg . Patient made aware that additional rx's are at the [address] for pick up. Patient requesting that her parents pick up rx, they are on HIPPA forms. Rx at front desk of [address]./ S.Hermena Swint,CMA

## 2018-01-28 ENCOUNTER — Telehealth: Payer: Self-pay | Admitting: Physician Assistant

## 2018-01-28 DIAGNOSIS — Z3041 Encounter for surveillance of contraceptive pills: Secondary | ICD-10-CM

## 2018-01-28 MED ORDER — LEVONORGESTREL-ETHINYL ESTRAD 0.1-20 MG-MCG PO TABS: 1.0000 | ORAL_TABLET | Freq: Every day | ORAL | 1 refills | Status: DC

## 2018-01-28 NOTE — Telephone Encounter (Signed)
Megan [name] refill Last OV: 05/21/17 Last Refill:05/21/17 Pharmacy:[facility] [address]  #3 with 1 refill

## 2018-01-28 NOTE — Telephone Encounter (Signed)
Copied from CRM [phone]. Topic: Quick Communication - See Telephone Encounter >> Jan 28, 2018  1:52 PM Terisa Starraylor, Brittany L wrote: CRM for notification. See Telephone encounter for:   01/28/18.  Patient said that she needs levonorgestrel-ethinyl estradiol (AVIANE) 0.1-20 MG-MCG tablet sent to a new pharmacy bc she is at school  [facility] [address] [place], [phone]

## 2018-03-27 ENCOUNTER — Encounter: Payer: Self-pay | Admitting: Physician Assistant

## 2018-03-29 ENCOUNTER — Telehealth: Payer: Self-pay | Admitting: Physician Assistant

## 2018-03-29 NOTE — Telephone Encounter (Signed)
Provider, see MyChart message dated 03/27/2018.

## 2018-03-29 NOTE — Telephone Encounter (Signed)
Copied from CRM [phone]. Topic: Quick Communication - See Telephone Encounter >> Mar 29, 2018  4:43 PM Arlyss Gandyichardson, Junie Avilla N, NT wrote: CRM for notification. See Telephone encounter for: 03/29/18. Pt calling to see ig Whitney McVey would be willing to write a letter on her behalf due to her medical history for her to have an emotional support pet? Please advise.

## 2018-06-10 ENCOUNTER — Other Ambulatory Visit: Payer: Self-pay

## 2018-06-10 ENCOUNTER — Encounter: Payer: Self-pay | Admitting: Physician Assistant

## 2018-06-10 DIAGNOSIS — Z3041 Encounter for surveillance of contraceptive pills: Secondary | ICD-10-CM

## 2018-06-10 MED ORDER — LEVONORGESTREL-ETHINYL ESTRAD 0.1-20 MG-MCG PO TABS: 1.0000 | ORAL_TABLET | Freq: Every day | ORAL | 1 refills | Status: DC

## 2018-11-04 ENCOUNTER — Encounter: Payer: Self-pay | Admitting: Physician Assistant

## 2018-11-05 ENCOUNTER — Other Ambulatory Visit: Payer: Self-pay | Admitting: *Deleted

## 2018-11-05 DIAGNOSIS — Z3041 Encounter for surveillance of contraceptive pills: Secondary | ICD-10-CM

## 2018-11-05 MED ORDER — LEVONORGESTREL-ETHINYL ESTRAD 0.1-20 MG-MCG PO TABS: 1.0000 | ORAL_TABLET | Freq: Every day | ORAL | 0 refills | Status: AC

## 2018-12-06 ENCOUNTER — Encounter: Payer: Self-pay | Admitting: Physician Assistant

## 2023-02-21 DIAGNOSIS — F4323 Adjustment disorder with mixed anxiety and depressed mood: Secondary | ICD-10-CM | POA: Diagnosis not present

## 2023-02-28 DIAGNOSIS — F4323 Adjustment disorder with mixed anxiety and depressed mood: Secondary | ICD-10-CM | POA: Diagnosis not present

## 2023-03-09 DIAGNOSIS — F4323 Adjustment disorder with mixed anxiety and depressed mood: Secondary | ICD-10-CM | POA: Diagnosis not present

## 2023-03-16 DIAGNOSIS — F4323 Adjustment disorder with mixed anxiety and depressed mood: Secondary | ICD-10-CM | POA: Diagnosis not present

## 2023-04-05 DIAGNOSIS — F4323 Adjustment disorder with mixed anxiety and depressed mood: Secondary | ICD-10-CM | POA: Diagnosis not present

## 2023-04-11 DIAGNOSIS — F4323 Adjustment disorder with mixed anxiety and depressed mood: Secondary | ICD-10-CM | POA: Diagnosis not present

## 2023-04-18 DIAGNOSIS — F4323 Adjustment disorder with mixed anxiety and depressed mood: Secondary | ICD-10-CM | POA: Diagnosis not present

## 2023-04-25 DIAGNOSIS — F4323 Adjustment disorder with mixed anxiety and depressed mood: Secondary | ICD-10-CM | POA: Diagnosis not present

## 2023-05-09 DIAGNOSIS — F4323 Adjustment disorder with mixed anxiety and depressed mood: Secondary | ICD-10-CM | POA: Diagnosis not present

## 2023-05-16 DIAGNOSIS — F4323 Adjustment disorder with mixed anxiety and depressed mood: Secondary | ICD-10-CM | POA: Diagnosis not present

## 2023-05-23 DIAGNOSIS — F4323 Adjustment disorder with mixed anxiety and depressed mood: Secondary | ICD-10-CM | POA: Diagnosis not present

## 2023-06-08 DIAGNOSIS — F4323 Adjustment disorder with mixed anxiety and depressed mood: Secondary | ICD-10-CM | POA: Diagnosis not present

## 2023-06-29 DIAGNOSIS — F4323 Adjustment disorder with mixed anxiety and depressed mood: Secondary | ICD-10-CM | POA: Diagnosis not present

## 2023-07-05 DIAGNOSIS — F4323 Adjustment disorder with mixed anxiety and depressed mood: Secondary | ICD-10-CM | POA: Diagnosis not present

## 2023-07-10 DIAGNOSIS — F4323 Adjustment disorder with mixed anxiety and depressed mood: Secondary | ICD-10-CM | POA: Diagnosis not present

## 2023-07-16 DIAGNOSIS — F4323 Adjustment disorder with mixed anxiety and depressed mood: Secondary | ICD-10-CM | POA: Diagnosis not present

## 2023-07-23 DIAGNOSIS — F4323 Adjustment disorder with mixed anxiety and depressed mood: Secondary | ICD-10-CM | POA: Diagnosis not present

## 2023-07-31 DIAGNOSIS — F4323 Adjustment disorder with mixed anxiety and depressed mood: Secondary | ICD-10-CM | POA: Diagnosis not present

## 2023-08-06 DIAGNOSIS — F4323 Adjustment disorder with mixed anxiety and depressed mood: Secondary | ICD-10-CM | POA: Diagnosis not present

## 2023-08-13 DIAGNOSIS — F4323 Adjustment disorder with mixed anxiety and depressed mood: Secondary | ICD-10-CM | POA: Diagnosis not present

## 2023-08-27 DIAGNOSIS — F4323 Adjustment disorder with mixed anxiety and depressed mood: Secondary | ICD-10-CM | POA: Diagnosis not present
